# Patient Record
Sex: Female | Born: 1990 | Race: White | Hispanic: No | Marital: Single | Smoking: Never smoker
Health system: Southern US, Community
[De-identification: ages and names within clinical notes are randomized; demographics above are authoritative.]

## PROBLEM LIST (undated history)

## (undated) DIAGNOSIS — F79 Unspecified intellectual disabilities: Secondary | ICD-10-CM

## (undated) DIAGNOSIS — F819 Developmental disorder of scholastic skills, unspecified: Secondary | ICD-10-CM

## (undated) HISTORY — PX: WISDOM TOOTH EXTRACTION: SHX21

## (undated) HISTORY — PX: NO PAST SURGERIES: SHX2092

---

## 2015-11-27 ENCOUNTER — Other Ambulatory Visit: Payer: Self-pay | Admitting: Family Medicine

## 2015-11-27 ENCOUNTER — Ambulatory Visit
Admission: RE | Admit: 2015-11-27 | Discharge: 2015-11-27 | Disposition: A | Discharge status: Home / Self Care | Payer: Medicaid Other | Source: Ambulatory Visit | Attending: Family Medicine | Admitting: Family Medicine

## 2015-11-27 DIAGNOSIS — M25572 Pain in left ankle and joints of left foot: Secondary | ICD-10-CM

## 2016-12-23 IMAGING — CR DG ANKLE COMPLETE 3+V*L*
3 series · 3 of 3 positions shown · non-contrast
Comparison: None.

CLINICAL DATA: Ankle pain and swelling for several weeks.

EXAM:
LEFT ANKLE COMPLETE - 3+ VIEW

[view not recorded (1 of 3)]
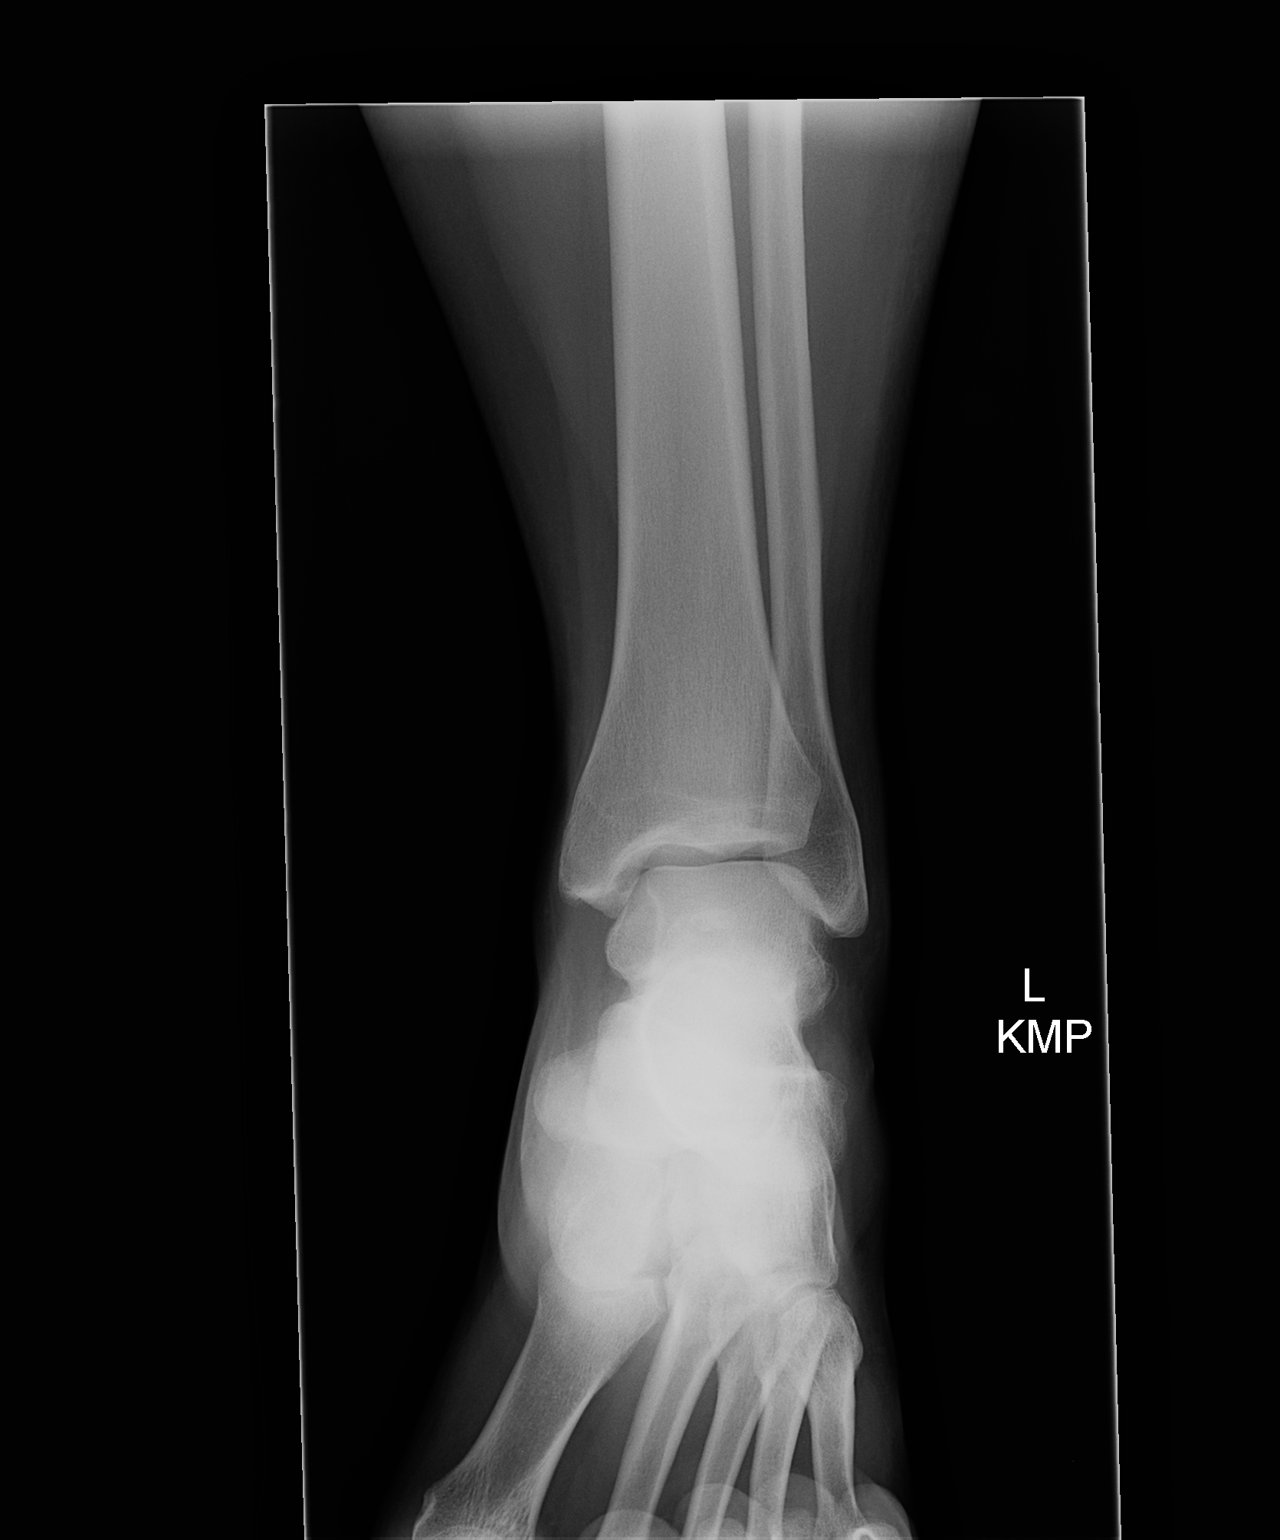

[view not recorded (2 of 3)]
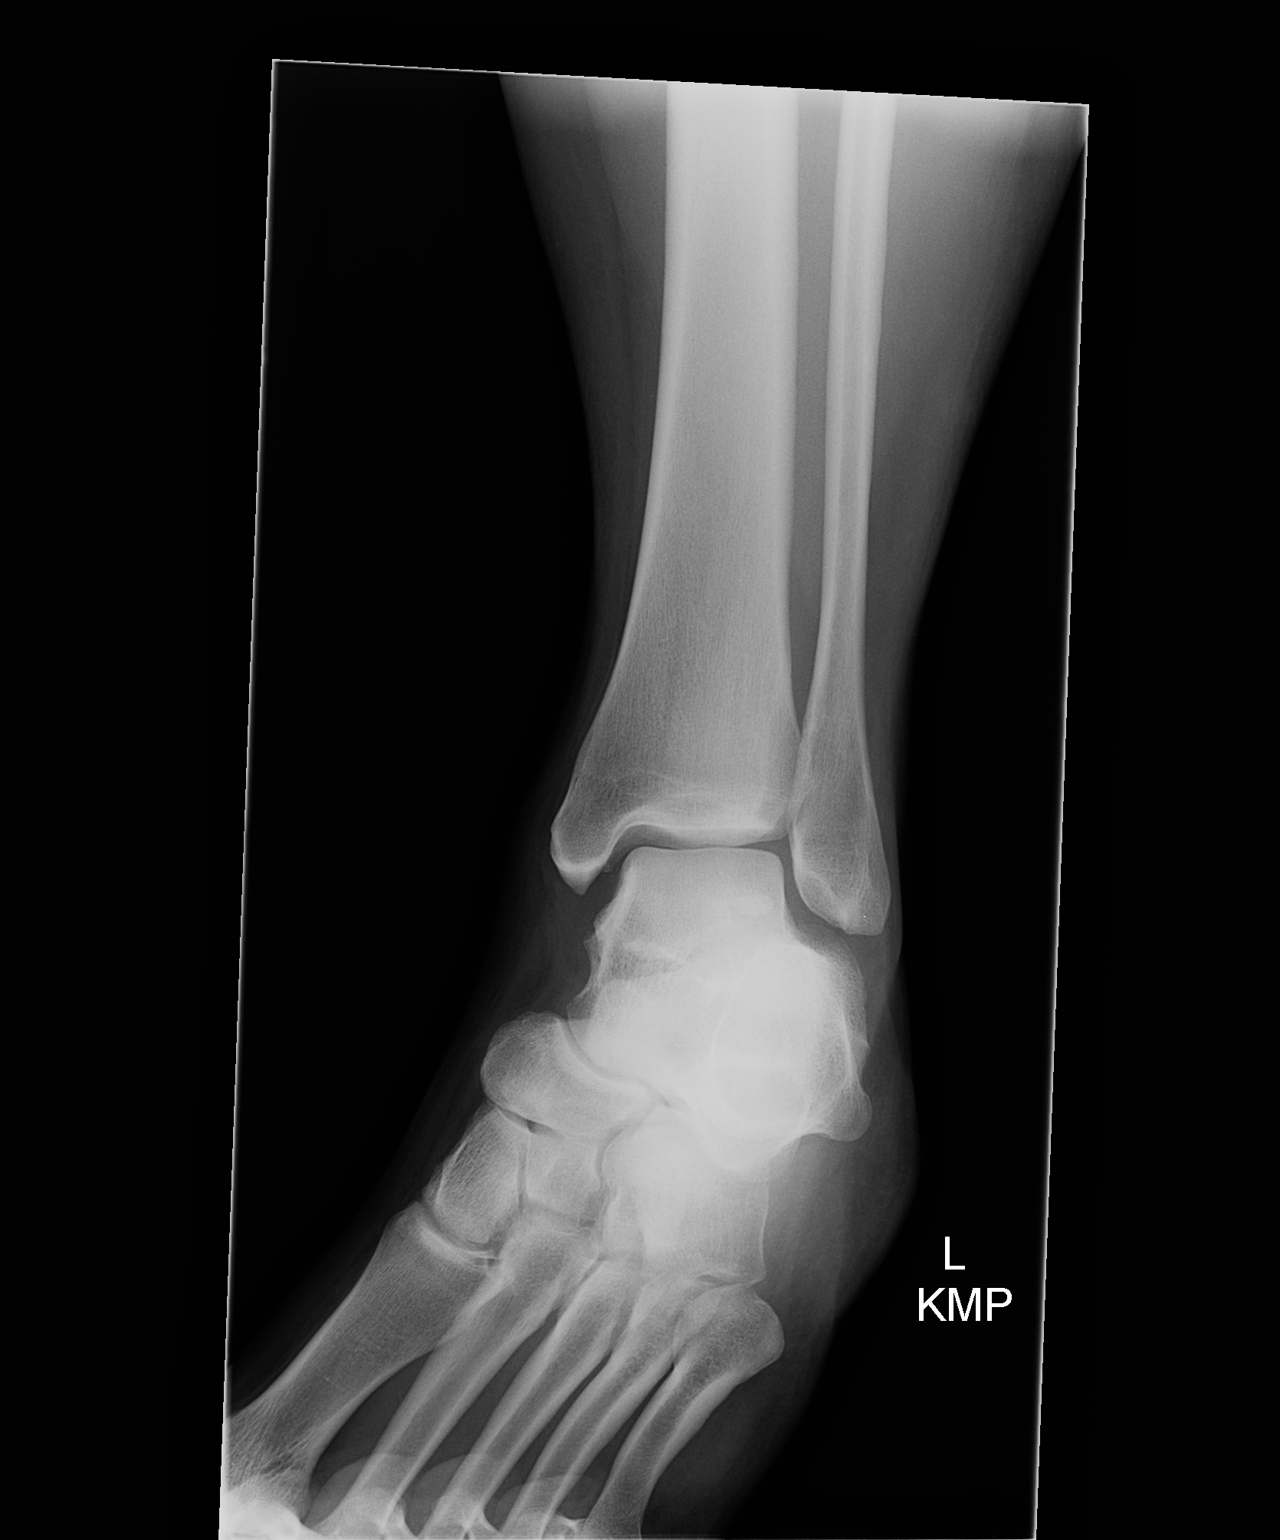

[view not recorded (3 of 3)]
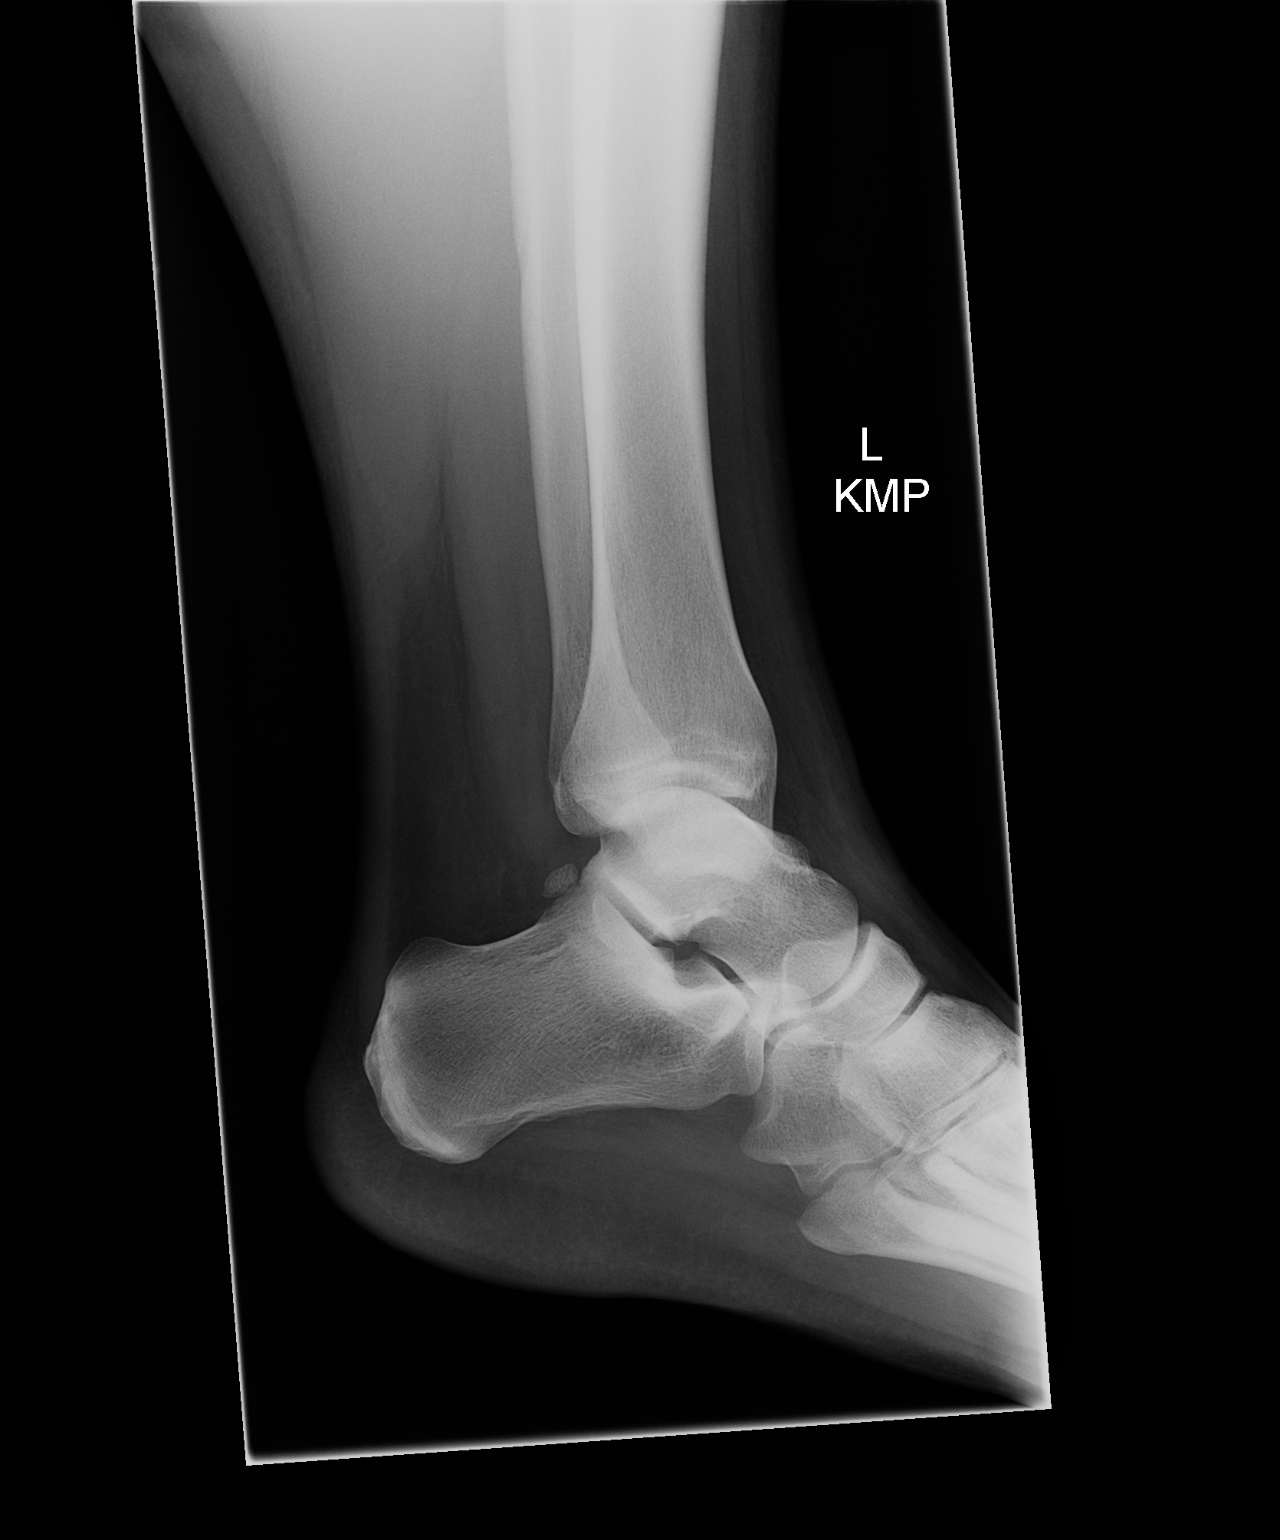

[3 of 3 positions shown; findings below may reference images not displayed]

FINDINGS: No acute fracture. No dislocation.  Unremarkable soft tissues.
IMPRESSION: No acute bony pathology.

## 2019-07-01 ENCOUNTER — Encounter (HOSPITAL_BASED_OUTPATIENT_CLINIC_OR_DEPARTMENT_OTHER): Payer: Self-pay | Admitting: *Deleted

## 2019-07-01 ENCOUNTER — Other Ambulatory Visit: Payer: Self-pay

## 2019-07-01 NOTE — Progress Notes (Signed)
Spoke w/ via phone for pre-op interview---mother lorie white Lab needs dos----              Lab results------ COVID test ------cbc, urine preg Arrive at -------530 am 07-05-2019 NPO after ------midnight Medications to take morning of surgery -----none Diabetic medication -----n/a Patient Special Instructions ----- Pre-Op special Istructions ----- Patient verbalized understanding of instructions that were given at this phone interview. Patient denies shortness of breath, chest pain, fever, cough a this phone interview.

## 2019-07-02 ENCOUNTER — Other Ambulatory Visit (HOSPITAL_COMMUNITY)
Admission: RE | Admit: 2019-07-02 | Discharge: 2019-07-02 | Disposition: A | Discharge status: Home / Self Care | Payer: Medicaid Other | Source: Ambulatory Visit | Attending: Obstetrics and Gynecology | Admitting: Obstetrics and Gynecology

## 2019-07-02 DIAGNOSIS — Z01812 Encounter for preprocedural laboratory examination: Secondary | ICD-10-CM | POA: Insufficient documentation

## 2019-07-02 DIAGNOSIS — U071 COVID-19: Secondary | ICD-10-CM | POA: Insufficient documentation

## 2019-07-03 LAB — NOVEL CORONAVIRUS, NAA (HOSP ORDER, SEND-OUT TO REF LAB; TAT 18-24 HRS): SARS-CoV-2, NAA: DETECTED — AB

## 2019-07-03 NOTE — Progress Notes (Signed)
Noted in epic this pt covid test result is back and is positive.  Called and spoke w/ Jarrett Soho , or scheduler for dr bovard-stucket, inform her positive covid test and pt will need to be cancelled.

## 2019-07-05 ENCOUNTER — Ambulatory Visit (HOSPITAL_BASED_OUTPATIENT_CLINIC_OR_DEPARTMENT_OTHER)
Admission: RE | Admit: 2019-07-05 | Payer: Medicaid Other | Source: Home / Self Care | Admitting: Obstetrics and Gynecology

## 2019-07-05 ENCOUNTER — Telehealth: Payer: Self-pay | Admitting: Unknown Physician Specialty

## 2019-07-05 HISTORY — DX: Developmental disorder of scholastic skills, unspecified: F81.9

## 2019-07-05 SURGERY — DILATATION & CURETTAGE/HYSTEROSCOPY WITH NOVASURE ABLATION
Anesthesia: General

## 2019-07-05 NOTE — Telephone Encounter (Signed)
Discussed with mother (guardian)  about Covid symptoms and the use of bamlanivimab, a monoclonal antibody infusion for those with mild to moderate Covid symptoms and at a high risk of hospitalization. Pt is asymptomatic and does not qualify for treatment

## 2019-07-25 ENCOUNTER — Encounter (HOSPITAL_BASED_OUTPATIENT_CLINIC_OR_DEPARTMENT_OTHER): Payer: Self-pay | Admitting: Obstetrics and Gynecology

## 2019-07-29 ENCOUNTER — Other Ambulatory Visit: Payer: Self-pay

## 2019-07-29 ENCOUNTER — Encounter (HOSPITAL_BASED_OUTPATIENT_CLINIC_OR_DEPARTMENT_OTHER): Payer: Self-pay | Admitting: Obstetrics and Gynecology

## 2019-07-29 NOTE — Progress Notes (Signed)
Spoke w/ via phone for pre-op interview--- Pt's mother, Calton Dach, whom is primary caregiver Lab needs dos--- CBC, Urine preg             Lab results------  Positive covid test 07-03-2019 COVID test ------  Pt does not need retested;  Pt was asymptomatic and continued to be with no symptoms  Arrive at ------- 0600 NPO after ------ [place] Medications to take morning of surgery ----- NONE Diabetic medication ----- n/a Patient Special Instructions ----- n/a  Pre-Op special Istructions -----  Pt is mentally disabled, lives w/ mother,  whom signs consent and will need to with pt in pre-op  Patient mother verbalized understanding of instructions that were given at this phone interview. Patient mother denies pt has shortness of breath, chest pain, fever, cough a this phone interview.

## 2019-07-31 ENCOUNTER — Encounter (HOSPITAL_BASED_OUTPATIENT_CLINIC_OR_DEPARTMENT_OTHER): Payer: Self-pay | Admitting: Obstetrics and Gynecology

## 2019-07-31 NOTE — H&P (Signed)
Latasha Figueroa is a 29 y.o. female G0 with menorrhagia and extended depo-provera use.  Pt is learning disabled and is not hygenically able to care for herself with menses.  D/W pt and mother r/b/a of hysteroscopy/D&C and novasure endometrial ablation.    OB History   G0 No abn pap No STD Not sexually active    Past Medical History:  Diagnosis Date  . Learning disability    per pt mother --- pt lives at home mother,  unable to live by herself,  pt at level approx. age 77 and unable retain information  . Menorrhagia   . Mentally disabled    07-29-2019  per pt was dx early in school as educational mentally disabled   Past Surgical History:  Procedure Laterality Date  .     Marland Kitchen WISDOM TOOTH EXTRACTION     Family History: CAD, MI, HTN, DM Social History:  reports that she has never smoked. She has never used smokeless tobacco. She reports that she does not drink alcohol or use drugs. unemployed, single  Meds: Depo All NKDA      Review of Systems  Constitutional: Negative.   HENT: Negative.   Eyes: Negative.   Respiratory: Negative.   Cardiovascular: Negative.   Gastrointestinal: Negative.   Genitourinary: Positive for menstrual problem.  Musculoskeletal: Negative.   Skin: Negative.   Neurological: Negative.   Psychiatric/Behavioral: Negative.    History   Height 5' 7.5" (1.715 m), weight 124.3 kg. Exam Physical Exam  Constitutional: She is oriented to person, place, and time. She appears well-developed and well-nourished.  HENT:  Head: Normocephalic and atraumatic.  Cardiovascular: Normal rate and regular rhythm.  Respiratory: Effort normal and breath sounds normal. No respiratory distress. She has no wheezes.  GI: Soft. Bowel sounds are normal. She exhibits no distension. There is no abdominal tenderness.  Musculoskeletal:        General: Normal range of motion.  Neurological: She is alert and oriented to person, place, and time.  Skin: Skin is warm and dry.   Psychiatric: She has a normal mood and affect. Her behavior is normal.    Assessment/Plan: 28yo G0 with menorrhagia for hysteroscopy/D&C/hysteroscopy D/w pt r/b/a of hysteroscopy/D&C/endometrial ablation  Latasha Figueroa 07/31/2019, 6:16 PM

## 2019-07-31 NOTE — Anesthesia Preprocedure Evaluation (Addendum)
Anesthesia Evaluation  Patient identified by MRN, date of birth, ID band Patient awake    Reviewed: Allergy & Precautions, NPO status , Patient's Chart, lab work & pertinent test results  Airway Mallampati: II  TM Distance: >3 FB Neck ROM: Full    Dental no notable dental hx. (+) Teeth Intact   Pulmonary  Covid + 4 weeks ago   Pulmonary exam normal breath sounds clear to auscultation       Cardiovascular negative cardio ROS Normal cardiovascular exam Rhythm:Regular Rate:Normal     Neuro/Psych PSYCHIATRIC DISORDERS Mentally disablednegative neurological ROS     GI/Hepatic negative GI ROS, Neg liver ROS,   Endo/Other  Morbid obesity  Renal/GU negative Renal ROS  negative genitourinary   Musculoskeletal negative musculoskeletal ROS (+)   Abdominal (+) + obese,   Peds  Hematology   Anesthesia Other Findings   Reproductive/Obstetrics Menorrhagia                            Anesthesia Physical Anesthesia Plan  ASA: III  Anesthesia Plan: General   Post-op Pain Management:    Induction: Intravenous  PONV Risk Score and Plan: 4 or greater and Ondansetron, Dexamethasone, Midazolam, Scopolamine patch - Pre-op and Treatment may vary due to age or medical condition  Airway Management Planned: LMA  Additional Equipment:   Intra-op Plan:   Post-operative Plan: Extubation in OR  Informed Consent: I have reviewed the patients History and Physical, chart, labs and discussed the procedure including the risks, benefits and alternatives for the proposed anesthesia with the patient or authorized representative who has indicated his/her understanding and acceptance.     Dental advisory given  Plan Discussed with: CRNA and Surgeon  Anesthesia Plan Comments:        Anesthesia Quick Evaluation

## 2019-08-01 ENCOUNTER — Encounter (HOSPITAL_BASED_OUTPATIENT_CLINIC_OR_DEPARTMENT_OTHER): Payer: Self-pay | Admitting: Obstetrics and Gynecology

## 2019-08-01 ENCOUNTER — Ambulatory Visit (HOSPITAL_BASED_OUTPATIENT_CLINIC_OR_DEPARTMENT_OTHER): Payer: Medicaid Other | Admitting: Anesthesiology

## 2019-08-01 ENCOUNTER — Encounter (HOSPITAL_BASED_OUTPATIENT_CLINIC_OR_DEPARTMENT_OTHER)
Admission: RE | Disposition: A | Discharge status: Home / Self Care | Payer: Self-pay | Source: Home / Self Care | Attending: Obstetrics and Gynecology

## 2019-08-01 ENCOUNTER — Ambulatory Visit (HOSPITAL_BASED_OUTPATIENT_CLINIC_OR_DEPARTMENT_OTHER)
Admission: RE | Admit: 2019-08-01 | Discharge: 2019-08-01 | Disposition: A | Discharge status: Home / Self Care | Payer: Medicaid Other | Attending: Obstetrics and Gynecology | Admitting: Obstetrics and Gynecology

## 2019-08-01 DIAGNOSIS — Z6841 Body Mass Index (BMI) 40.0 and over, adult: Secondary | ICD-10-CM | POA: Diagnosis not present

## 2019-08-01 DIAGNOSIS — F819 Developmental disorder of scholastic skills, unspecified: Secondary | ICD-10-CM | POA: Diagnosis not present

## 2019-08-01 DIAGNOSIS — Y92530 Ambulatory surgery center as the place of occurrence of the external cause: Secondary | ICD-10-CM | POA: Insufficient documentation

## 2019-08-01 DIAGNOSIS — N9971 Accidental puncture and laceration of a genitourinary system organ or structure during a genitourinary system procedure: Secondary | ICD-10-CM | POA: Diagnosis not present

## 2019-08-01 DIAGNOSIS — S3769XA Other injury of uterus, initial encounter: Secondary | ICD-10-CM | POA: Insufficient documentation

## 2019-08-01 DIAGNOSIS — N920 Excessive and frequent menstruation with regular cycle: Secondary | ICD-10-CM | POA: Diagnosis present

## 2019-08-01 DIAGNOSIS — F79 Unspecified intellectual disabilities: Secondary | ICD-10-CM | POA: Insufficient documentation

## 2019-08-01 DIAGNOSIS — Z8616 Personal history of COVID-19: Secondary | ICD-10-CM | POA: Diagnosis not present

## 2019-08-01 HISTORY — DX: Unspecified intellectual disabilities: F79

## 2019-08-01 HISTORY — DX: Excessive and frequent menstruation with regular cycle: N92.0

## 2019-08-01 HISTORY — PX: DILITATION & CURRETTAGE/HYSTROSCOPY WITH NOVASURE ABLATION: SHX5568

## 2019-08-01 LAB — POCT PREGNANCY, URINE: Preg Test, Ur: NEGATIVE

## 2019-08-01 LAB — CBC
HCT: 40.2 % (ref 36.0–46.0)
Hemoglobin: 13 g/dL (ref 12.0–15.0)
MCH: 28.5 pg (ref 26.0–34.0)
MCHC: 32.3 g/dL (ref 30.0–36.0)
MCV: 88.2 fL (ref 80.0–100.0)
Platelets: 245 10*3/uL (ref 150–400)
RBC: 4.56 MIL/uL (ref 3.87–5.11)
RDW: 12.2 % (ref 11.5–15.5)
WBC: 9.4 10*3/uL (ref 4.0–10.5)
nRBC: 0 % (ref 0.0–0.2)

## 2019-08-01 SURGERY — DILATATION & CURETTAGE/HYSTEROSCOPY WITH NOVASURE ABLATION
Anesthesia: General | Site: Vagina

## 2019-08-01 MED ORDER — FENTANYL CITRATE (PF) 100 MCG/2ML IJ SOLN
INTRAMUSCULAR | Status: AC
  Filled 2019-08-01: qty 2

## 2019-08-01 MED ORDER — LACTATED RINGERS IV SOLN
INTRAVENOUS | Status: DC
  Filled 2019-08-01: qty 1000

## 2019-08-01 MED ORDER — MIDAZOLAM HCL 5 MG/5ML IJ SOLN
INTRAMUSCULAR | Status: DC | PRN
  Administered 2019-08-01 (×2): .5 mg via INTRAVENOUS

## 2019-08-01 MED ORDER — OXYCODONE HCL 5 MG PO TABS
5.0000 mg | ORAL_TABLET | Freq: Once | ORAL | Status: DC | PRN
  Filled 2019-08-01: qty 1

## 2019-08-01 MED ORDER — IBUPROFEN 600 MG PO TABS: 600.0000 mg | ORAL_TABLET | Freq: Four times a day (QID) | ORAL | 1 refills | Status: AC | PRN

## 2019-08-01 MED ORDER — DEXAMETHASONE SODIUM PHOSPHATE 10 MG/ML IJ SOLN
INTRAMUSCULAR | Status: AC
  Filled 2019-08-01: qty 1

## 2019-08-01 MED ORDER — PROPOFOL 10 MG/ML IV BOLUS
INTRAVENOUS | Status: DC | PRN
  Administered 2019-08-01: 150 mg via INTRAVENOUS

## 2019-08-01 MED ORDER — METOCLOPRAMIDE HCL 5 MG/ML IJ SOLN
10.0000 mg | Freq: Once | INTRAMUSCULAR | Status: DC | PRN
  Filled 2019-08-01: qty 2

## 2019-08-01 MED ORDER — FENTANYL CITRATE (PF) 100 MCG/2ML IJ SOLN
INTRAMUSCULAR | Status: DC | PRN
  Administered 2019-08-01 (×2): 25 ug via INTRAVENOUS

## 2019-08-01 MED ORDER — MEPERIDINE HCL 25 MG/ML IJ SOLN
6.2500 mg | INTRAMUSCULAR | Status: DC | PRN
  Filled 2019-08-01: qty 1

## 2019-08-01 MED ORDER — PROPOFOL 10 MG/ML IV BOLUS
INTRAVENOUS | Status: AC
  Filled 2019-08-01: qty 20

## 2019-08-01 MED ORDER — FENTANYL CITRATE (PF) 100 MCG/2ML IJ SOLN
25.0000 ug | INTRAMUSCULAR | Status: DC | PRN
  Filled 2019-08-01: qty 1

## 2019-08-01 MED ORDER — ONDANSETRON HCL 4 MG/2ML IJ SOLN
INTRAMUSCULAR | Status: DC | PRN
  Administered 2019-08-01: 4 mg via INTRAVENOUS

## 2019-08-01 MED ORDER — MIDAZOLAM HCL 2 MG/2ML IJ SOLN
INTRAMUSCULAR | Status: AC
  Filled 2019-08-01: qty 2

## 2019-08-01 MED ORDER — SCOPOLAMINE 1 MG/3DAYS TD PT72
1.0000 | MEDICATED_PATCH | TRANSDERMAL | Status: DC
  Filled 2019-08-01: qty 1

## 2019-08-01 MED ORDER — ONDANSETRON HCL 4 MG/2ML IJ SOLN
INTRAMUSCULAR | Status: AC
  Filled 2019-08-01: qty 2

## 2019-08-01 MED ORDER — OXYCODONE HCL 5 MG/5ML PO SOLN
5.0000 mg | Freq: Once | ORAL | Status: DC | PRN
  Filled 2019-08-01: qty 5

## 2019-08-01 MED ORDER — SODIUM CHLORIDE 0.9 % IR SOLN
Status: DC | PRN
  Administered 2019-08-01: 3000 mL

## 2019-08-01 MED ORDER — DEXAMETHASONE SODIUM PHOSPHATE 10 MG/ML IJ SOLN
INTRAMUSCULAR | Status: DC | PRN
  Administered 2019-08-01: 10 mg via INTRAVENOUS

## 2019-08-01 MED ORDER — LIDOCAINE 2% (20 MG/ML) 5 ML SYRINGE
INTRAMUSCULAR | Status: AC
  Filled 2019-08-01: qty 5

## 2019-08-01 MED ORDER — LIDOCAINE 2% (20 MG/ML) 5 ML SYRINGE
INTRAMUSCULAR | Status: DC | PRN
  Administered 2019-08-01: 60 mg via INTRAVENOUS

## 2019-08-01 MED ORDER — LIDOCAINE HCL 1 % IJ SOLN
INTRAMUSCULAR | Status: DC | PRN
  Administered 2019-08-01: 10 mL

## 2019-08-01 SURGICAL SUPPLY — 16 items
ABLATOR SURESOUND NOVASURE (ABLATOR) ×3 IMPLANT
CANISTER SUCT 3000ML PPV (MISCELLANEOUS) ×3 IMPLANT
CATH ROBINSON RED A/P 16FR (CATHETERS) ×3 IMPLANT
COVER WAND RF STERILE (DRAPES) ×3 IMPLANT
DILATOR CANAL MILEX (MISCELLANEOUS) IMPLANT
GAUZE 4X4 16PLY RFD (DISPOSABLE) ×3 IMPLANT
GLOVE BIO SURGEON STRL SZ 6.5 (GLOVE) ×4 IMPLANT
GLOVE BIO SURGEONS STRL SZ 6.5 (GLOVE) ×2
GOWN STRL REUS W/TWL LRG LVL3 (GOWN DISPOSABLE) ×6 IMPLANT
IV NS IRRIG 3000ML ARTHROMATIC (IV SOLUTION) ×3 IMPLANT
KIT PROCEDURE FLUENT (KITS) ×3 IMPLANT
PACK VAGINAL MINOR WOMEN LF (CUSTOM PROCEDURE TRAY) ×3 IMPLANT
PAD OB MATERNITY 4.3X12.25 (PERSONAL CARE ITEMS) ×3 IMPLANT
SEAL CERVICAL OMNI LOK (ABLATOR) IMPLANT
SEAL ROD LENS SCOPE MYOSURE (ABLATOR) ×3 IMPLANT
TOWEL OR 17X26 10 PK STRL BLUE (TOWEL DISPOSABLE) ×3 IMPLANT

## 2019-08-01 NOTE — Interval H&P Note (Signed)
History and Physical Interval Note:  08/01/2019 7:42 AM  Latasha Figueroa  has presented today for surgery, with the diagnosis of menorrhagia.  The various methods of treatment have been discussed with the patient and family. After consideration of risks, benefits and other options for treatment, the patient has consented to  Procedure(s): DILATATION & CURETTAGE/HYSTEROSCOPY WITH NOVASURE ABLATION (N/A) as a surgical intervention.  The patient's history has been reviewed, patient examined, no change in status, stable for surgery.  I have reviewed the patient's chart and labs.  Questions were answered to the patient's satisfaction.     Jamille Fisher Bovard-Stuckert   

## 2019-08-01 NOTE — Brief Op Note (Signed)
08/01/2019  8:57 AM  PATIENT:  Latasha Figueroa  29 y.o. female  PRE-OPERATIVE DIAGNOSIS:  menorrhagia  POST-OPERATIVE DIAGNOSIS:  menorrhagia  PROCEDURE:  Procedure(s): DILATATION & CURETTAGE/HYSTEROSCOPY (N/A)  SURGEON:  Surgeon(s) and Role:    * Bovard-Stuckert, Ahman Dugdale, MD - Primary  ANESTHESIA:   general by LMA  EBL:  25 mL IVF and uop per anesthesia  FINDINGS: nl uterus, sounding length = 8cm, narrow cavity, 4 cm cervix, B ostia seen   Hysteroscopic deficeit 870cc  Complications: uterine perforation  DRAINS: none   LOCAL MEDICATIONS USED:  LIDOCAINE   SPECIMEN:  Source of Specimen:  Pap, Endometrial Currettings  DISPOSITION OF SPECIMEN:  PATHOLOGY  COUNTS:  YES  TOURNIQUET:  * No tourniquets in log *  DICTATION: .Other Dictation: Dictation Number [id number]  PLAN OF CARE: Discharge to home after PACU  PATIENT DISPOSITION:  PACU - hemodynamically stable.   Delay start of Pharmacological VTE agent (>24hrs) due to surgical blood loss or risk of bleeding: not applicable

## 2019-08-01 NOTE — Transfer of Care (Signed)
Immediate Anesthesia Transfer of Care Note  Patient: Latasha Figueroa  Procedure(s) Performed: DILATATION & CURETTAGE/HYSTEROSCOPY (N/A Vagina )  Patient Location: PACU  Anesthesia Type:General  Level of Consciousness: awake, alert  and oriented  Airway & Oxygen Therapy: Patient Spontanous Breathing and Patient connected to face mask oxygen  Post-op Assessment: Report given to RN and Post -op Vital signs reviewed and stable  Post vital signs: Reviewed and stable  Last Vitals:  Vitals Value Taken Time  BP 138/89 08/01/19 0845  Temp    Pulse 82 08/01/19 0845  Resp 16 08/01/19 0845  SpO2 100 % 08/01/19 0846  Vitals shown include unvalidated device data.  Last Pain:  Vitals:   08/01/19 0630  TempSrc: Oral  PainSc: 0-No pain      Patients Stated Pain Goal: 1 (08/01/19 0630)  Complications: No apparent anesthesia complications

## 2019-08-01 NOTE — Discharge Instructions (Signed)
  Post Anesthesia Home Care Instructions  Activity: Get plenty of rest for the remainder of the day. A responsible individual must stay with you for 24 hours following the procedure.  For the next 24 hours, DO NOT: -Drive a car -Operate machinery -Drink alcoholic beverages -Take any medication unless instructed by your physician -Make any legal decisions or sign important papers.  Meals: Start with liquid foods such as gelatin or soup. Progress to regular foods as tolerated. Avoid greasy, spicy, heavy foods. If nausea and/or vomiting occur, drink only clear liquids until the nausea and/or vomiting subsides. Call your physician if vomiting continues.  Special Instructions/Symptoms: Your throat may feel dry or sore from the anesthesia or the breathing tube placed in your throat during surgery. If this causes discomfort, gargle with warm salt water. The discomfort should disappear within 24 hours.  DISCHARGE INSTRUCTIONS: D&C  The following instructions have been prepared to help you care for yourself upon your return home.   Personal hygiene:  Use sanitary pads for vaginal drainage, not tampons.  Shower the day after your procedure.  NO tub baths, pools or Jacuzzis for 2-3 weeks.  Wipe front to back after using the bathroom.  Activity and limitations:  Do NOT drive or operate any equipment for 24 hours. The effects of anesthesia are still present and drowsiness may result.  Do NOT rest in bed all day.  Walking is encouraged.  Walk up and down stairs slowly.  You may resume your normal activity in one to two days or as indicated by your physician.  Sexual activity: NO intercourse for at least 2 weeks after the procedure, or as indicated by your physician.  Diet: Eat a light meal as desired this evening. You may resume your usual diet tomorrow.  Return to work: You may resume your work activities in one to two days or as indicated by your doctor.  What to expect after your surgery:  Expect to have vaginal bleeding/discharge for 2-3 days and spotting for up to 10 days. It is not unusual to have soreness for up to 1-2 weeks. You may have a slight burning sensation when you urinate for the first day. Mild cramps may continue for a couple of days. You may have a regular period in 2-6 weeks.  Call your doctor for any of the following:  Excessive vaginal bleeding, saturating and changing one pad every hour.  Inability to urinate 6 hours after discharge from hospital.  Pain not relieved by pain medication.  Fever of 100.4 F or greater.  Unusual vaginal discharge or odor.            

## 2019-08-01 NOTE — Op Note (Signed)
[name], [name] MEDICAL RECORD AQ:[id number] ACCOUNT [id number] DATE OF BIRTH:[birth year] FACILITY: [facility] LOCATION: [facility] PHYSICIAN:Ethelbert Thain BOVARD-STUCKERT, MD  OPERATIVE REPORT  DATE OF PROCEDURE:  08/01/2019  PREOPERATIVE DIAGNOSES:  Menorrhagia.  POSTOPERATIVE DIAGNOSIS:  Menorrhagia.  PROCEDURES:   1.  Hysteroscopy. 2.  Dilatation and curettage. 3.  Pap smear.  SURGEON:  Sherian Rein MD  ANESTHESIA:  General by LMA.  ESTIMATED BLOOD LOSS:  25 mL  IV FLUID AND URINE OUTPUT:  Per anesthesia.  HYSTEROSCOPIC DEFICIT:  870 mL.   FINDINGS:  Normal uterus and cavity length of 8 cm; however, narrow cavity with both bilateral ostia easily being seen from a midline position.  COMPLICATIONS:  Uterine perforation after hysteroscopy and D and C.   PATHOLOGY:  Pap smear as well as endometrial curettings.  DESCRIPTION OF PROCEDURE:  After informed consent was reviewed with the patient and her mother,  she was transported to the operating room where she was laid on the table and general anesthesia was induced and found to be adequate.  She was then placed  in the [place] stirrups, prepped and draped in the normal sterile fashion.  Her bladder was sterilely drained.  Her cervix was easily visualized with a Peterson speculum.  The anterior lip of her cervix was grasped with a single-tooth tenaculum and her  uterus sounded to 8 cm.  Cervix was dilated to accommodate a hysteroscope.  Cervical length was also found to be 4cm.  Initial visualization of the uterine cavity was normal, but somewhat narrow with both ostia being seen in the midline.  Otherwise,  normal cavity.  A curettage was performed of her uterine cavity.  Prior to performing the ablation, a second look hysteroscopy was performed.  At this time, a fundal perforation was noted.  The procedure was abandoned.  Instruments were removed from the  vagina.  The patient was awakened in stable condition and transported to  the PACU.  Sponge, lap and needle counts were correct x2 per the operating staff.  VN/NUANCE  D:08/01/2019 T:08/01/2019 JOB:[id number]

## 2019-08-01 NOTE — Anesthesia Procedure Notes (Signed)
Procedure Name: LMA Insertion Date/Time: 08/01/2019 8:00 AM Performed by: Orest Dikes, CRNA Pre-anesthesia Checklist: Patient identified, Emergency Drugs available, Suction available and Patient being monitored Patient Re-evaluated:Patient Re-evaluated prior to induction Oxygen Delivery Method: Circle system utilized Preoxygenation: Pre-oxygenation with 100% oxygen Induction Type: IV induction LMA: LMA inserted LMA Size: 4.0 Number of attempts: 1 Placement Confirmation: positive ETCO2 and breath sounds checked- equal and bilateral Tube secured with: Tape Dental Injury: Teeth and Oropharynx as per pre-operative assessment

## 2019-08-01 NOTE — Anesthesia Postprocedure Evaluation (Signed)
Anesthesia Post Note  Patient: Retail buyer  Procedure(s) Performed: DILATATION & CURETTAGE/HYSTEROSCOPY (N/A Vagina )     Patient location during evaluation: PACU Anesthesia Type: General Level of consciousness: awake and alert and oriented Pain management: pain level controlled Vital Signs Assessment: post-procedure vital signs reviewed and stable Respiratory status: spontaneous breathing, nonlabored ventilation and respiratory function stable Cardiovascular status: blood pressure returned to baseline and stable Postop Assessment: no apparent nausea or vomiting Anesthetic complications: no    Last Vitals:  Vitals:   08/01/19 0930 08/01/19 0945  BP: 135/84 123/84  Pulse: 69 79  Resp: 19 16  Temp:  36.7 C  SpO2: 98% 99%    Last Pain:  Vitals:   08/01/19 0945  TempSrc:   PainSc: 0-No pain                 Gibril Mastro A.

## 2019-08-01 NOTE — Interval H&P Note (Signed)
History and Physical Interval Note:  08/01/2019 7:42 AM  Latasha Figueroa  has presented today for surgery, with the diagnosis of menorrhagia.  The various methods of treatment have been discussed with the patient and family. After consideration of risks, benefits and other options for treatment, the patient has consented to  Procedure(s): DILATATION & CURETTAGE/HYSTEROSCOPY WITH NOVASURE ABLATION (N/A) as a surgical intervention.  The patient's history has been reviewed, patient examined, no change in status, stable for surgery.  I have reviewed the patient's chart and labs.  Questions were answered to the patient's satisfaction.     Lisanne Ponce Bovard-Stuckert

## 2019-08-02 LAB — CYTOLOGY - PAP: Diagnosis: NEGATIVE

## 2019-08-02 LAB — SURGICAL PATHOLOGY
